# Patient Record
Sex: Female | Born: 1985 | Race: White | Hispanic: No | Marital: Married | Smoking: Never smoker
Health system: Southern US, Community
[De-identification: ages and names within clinical notes are randomized; demographics above are authoritative.]

## PROBLEM LIST (undated history)

## (undated) DIAGNOSIS — T7840XA Allergy, unspecified, initial encounter: Secondary | ICD-10-CM

## (undated) HISTORY — PX: NO PAST SURGERIES: SHX2092

## (undated) HISTORY — DX: Allergy, unspecified, initial encounter: T78.40XA

---

## 2011-06-18 ENCOUNTER — Telehealth: Payer: Self-pay

## 2011-06-18 NOTE — Telephone Encounter (Signed)
Pt completed [employer], all records were copied out of [facility] chart, Pt called for Pick-up LMO Cell Phone.  06/18/11/km

## 2015-06-21 ENCOUNTER — Ambulatory Visit (INDEPENDENT_AMBULATORY_CARE_PROVIDER_SITE_OTHER): Payer: 59 | Admitting: Physician Assistant

## 2015-06-21 VITALS — BP 120/80 | HR 109 | Temp 98.0°F | Resp 16 | Ht 66.0 in | Wt 136.0 lb

## 2015-06-21 DIAGNOSIS — L505 Cholinergic urticaria: Secondary | ICD-10-CM

## 2015-06-21 MED ORDER — RANITIDINE HCL 150 MG PO TABS: 150.0000 mg | ORAL_TABLET | Freq: Two times a day (BID) | ORAL | Status: DC

## 2015-06-21 NOTE — Progress Notes (Signed)
   Subjective:    Patient ID: Beverly Cabrera, female    DOB: 1986-04-18, 29 y.o.   MRN: [id number]  HPI Patient presents for reaction to cold. Two days ago when cleaning out freezer hands became red and itchy and that night while drinking cold water felt as if throat was closing and tongue was bigger than it should be. Then while under A/C skin felt itchy. Left ice on skin to see if would have reaction. After 5 minutes had hives. Only thing that was different 2 days ago is she stopped taking her progesterone BC pill. Had only taken it for 1 week, but noticed a decrease in libido so discontinued. Had baby 2 months ago and is now using condoms to prevent pregnancy. Denies wheezing, SOB, choking, or apnea. Able to drink room temp water without any trouble. Took cetirizine last night with relief. NKDA.   Review of Systems  Constitutional: Negative for fever, chills and fatigue.  Skin: Positive for rash (resolved). Negative for color change, pallor and wound.  Neurological: Negative for dizziness and headaches.       Objective:   Physical Exam  Constitutional: She is oriented to person, place, and time. She appears well-developed and well-nourished. No distress.  Blood pressure 120/80, pulse 109, temperature 98 F (36.7 C), temperature source Oral, resp. rate 16, height  (1.676 m), weight 136 lb (61.689 kg), SpO2 98 %.   HENT:  Head: Normocephalic and atraumatic.  Right Ear: External ear normal.  Left Ear: External ear normal.  Mouth/Throat: Oropharynx is clear and moist. No oropharyngeal exudate.  Eyes: Conjunctivae are normal. Right eye exhibits no discharge. Left eye exhibits no discharge. No scleral icterus.  Neck: Normal range of motion. Neck supple.  Cardiovascular: Normal rate, regular rhythm and normal heart sounds.  Exam reveals no gallop and no friction rub.   No murmur heard. Pulmonary/Chest: Effort normal and breath sounds normal. No respiratory distress. She has no wheezes.  She has no rales.  Abdominal: Soft. Bowel sounds are normal.  Lymphadenopathy:    She has no cervical adenopathy.  Neurological: She is alert and oriented to person, place, and time.  Skin: Skin is warm and dry. No rash noted. She is not diaphoretic. No erythema. No pallor.  Hives do not develop following ice test  Psychiatric: She has a normal mood and affect. Her behavior is normal. Judgment and thought content normal.      Assessment & Plan:  1. Cholinergic urticaria Discussed risk of continuing cetirizine and if going to use should increase water intake to at least 64 oz daily. That said cetirizine is not recommended with breast feeding. Infant side effects discussed. - ranitidine (ZANTAC) 150 MG tablet; Take 1 tablet (150 mg total) by mouth 2 (two) times daily.  Dispense: 60 tablet; Refill: 0 - Ambulatory referral to Allergy   Janan Ridge PA-C  [facility] 06/21/2015 2:03 PM

## 2016-11-22 ENCOUNTER — Encounter (HOSPITAL_COMMUNITY): Payer: Self-pay

## 2016-12-09 ENCOUNTER — Other Ambulatory Visit (HOSPITAL_COMMUNITY): Payer: Self-pay | Admitting: Certified Nurse Midwife

## 2016-12-09 DIAGNOSIS — O358XX0 Maternal care for other (suspected) fetal abnormality and damage, not applicable or unspecified: Secondary | ICD-10-CM

## 2016-12-09 DIAGNOSIS — O43199 Other malformation of placenta, unspecified trimester: Secondary | ICD-10-CM

## 2016-12-09 DIAGNOSIS — IMO0002 Reserved for concepts with insufficient information to code with codable children: Secondary | ICD-10-CM

## 2016-12-09 DIAGNOSIS — O283 Abnormal ultrasonic finding on antenatal screening of mother: Secondary | ICD-10-CM

## 2016-12-09 DIAGNOSIS — Z3689 Encounter for other specified antenatal screening: Secondary | ICD-10-CM

## 2016-12-16 ENCOUNTER — Encounter (HOSPITAL_COMMUNITY): Payer: Self-pay | Admitting: *Deleted

## 2016-12-17 ENCOUNTER — Other Ambulatory Visit (HOSPITAL_COMMUNITY): Payer: Self-pay | Admitting: Certified Nurse Midwife

## 2016-12-17 ENCOUNTER — Encounter (HOSPITAL_COMMUNITY): Payer: Self-pay

## 2016-12-17 ENCOUNTER — Ambulatory Visit (HOSPITAL_COMMUNITY)
Admission: RE | Admit: 2016-12-17 | Discharge: 2016-12-17 | Disposition: A | Discharge status: Home / Self Care | Payer: Self-pay | Source: Ambulatory Visit | Attending: Certified Nurse Midwife | Admitting: Certified Nurse Midwife

## 2016-12-17 DIAGNOSIS — O283 Abnormal ultrasonic finding on antenatal screening of mother: Secondary | ICD-10-CM

## 2016-12-17 DIAGNOSIS — IMO0002 Reserved for concepts with insufficient information to code with codable children: Secondary | ICD-10-CM

## 2016-12-17 DIAGNOSIS — Z3689 Encounter for other specified antenatal screening: Secondary | ICD-10-CM

## 2016-12-17 DIAGNOSIS — O358XX0 Maternal care for other (suspected) fetal abnormality and damage, not applicable or unspecified: Secondary | ICD-10-CM

## 2016-12-17 DIAGNOSIS — Z3A22 22 weeks gestation of pregnancy: Secondary | ICD-10-CM

## 2016-12-17 DIAGNOSIS — O43199 Other malformation of placenta, unspecified trimester: Secondary | ICD-10-CM

## 2016-12-17 DIAGNOSIS — Z363 Encounter for antenatal screening for malformations: Secondary | ICD-10-CM | POA: Insufficient documentation

## 2017-04-09 IMAGING — US US MFM OB DETAIL+14 WK
1 series · 14 of 28 positions shown · non-contrast
Comparison: none

[Series 1: us mfm ob detail+14 wk · 88 acquisitions, 14 frames shown]
[im 4/88]
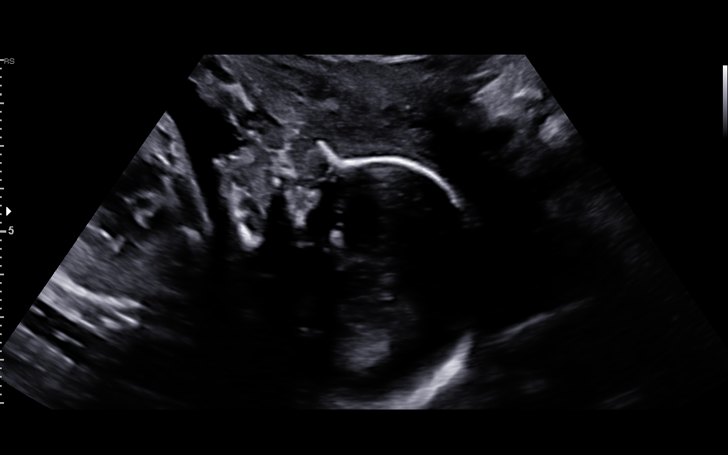
[im 10/88]
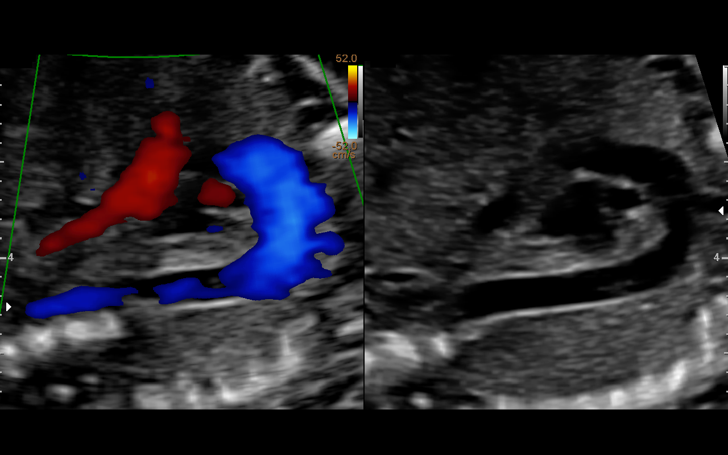
[im 17/88]
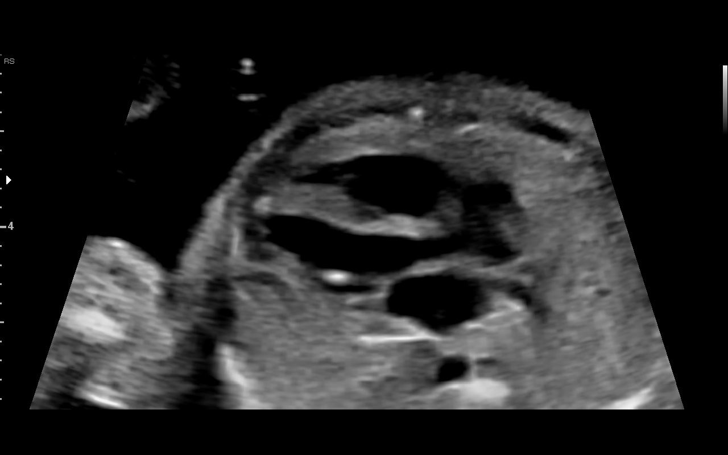
[im 23/88]
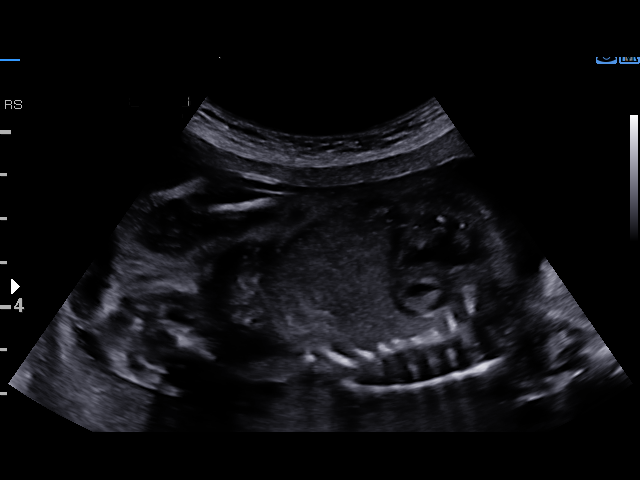
[im 30/88]
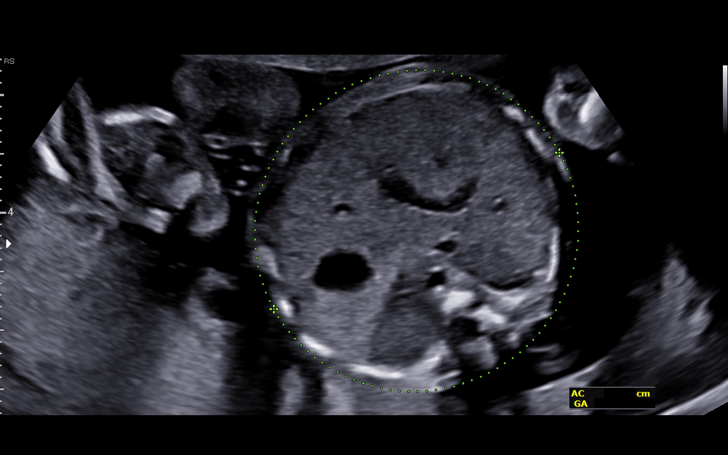
[im 36/88]
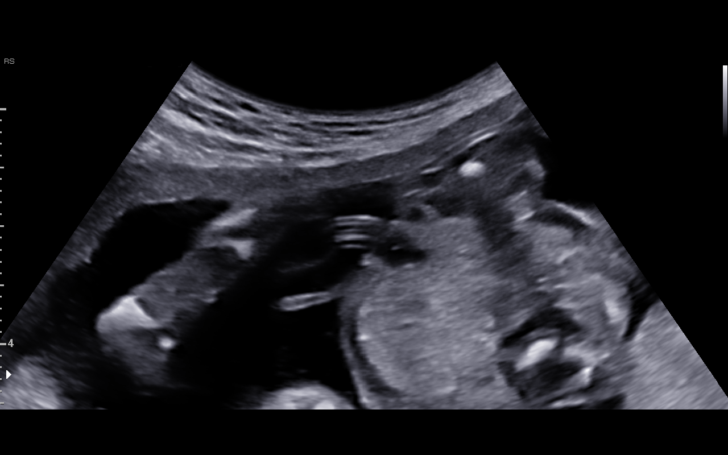
[im 42/88]
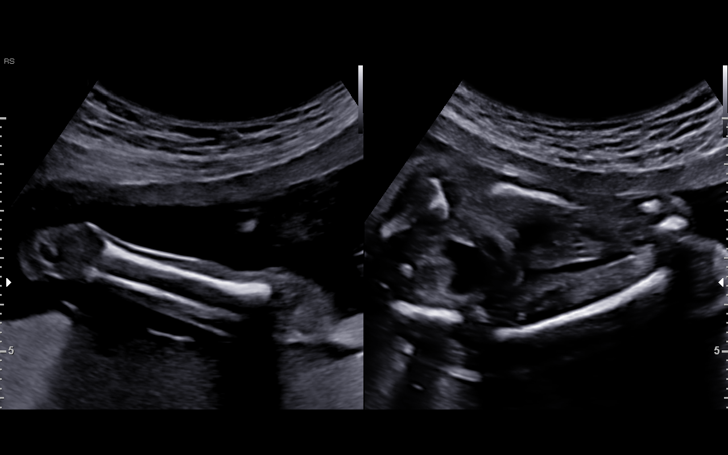
[im 49/88]
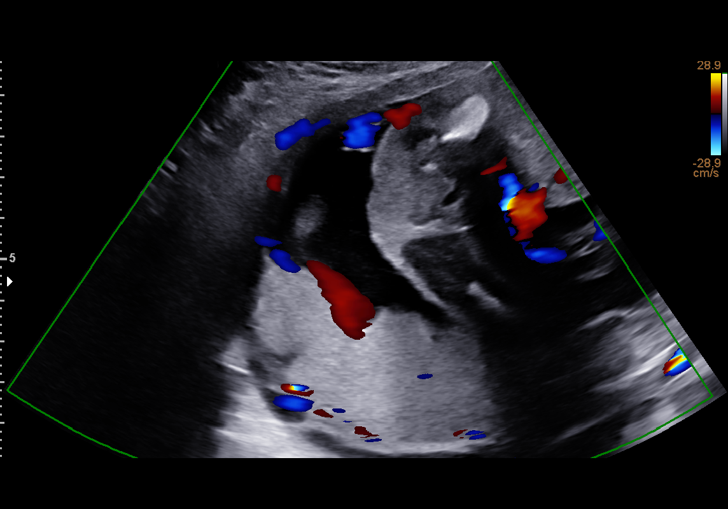
[im 55/88]
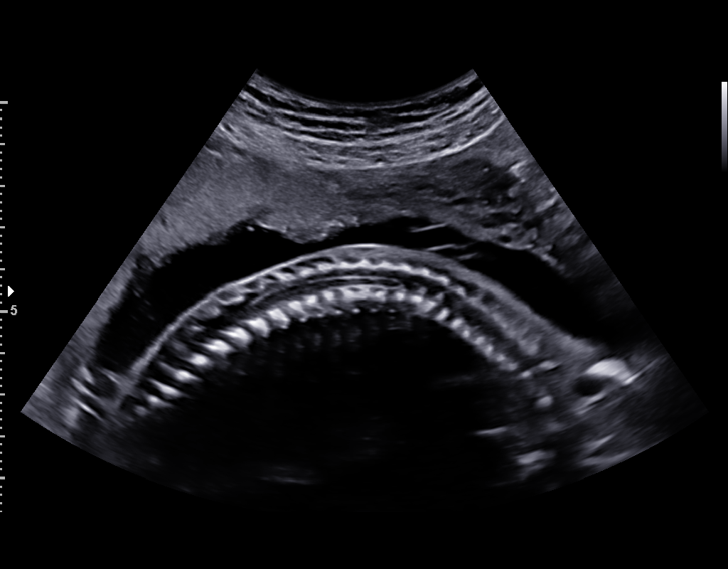
[im 62/88]
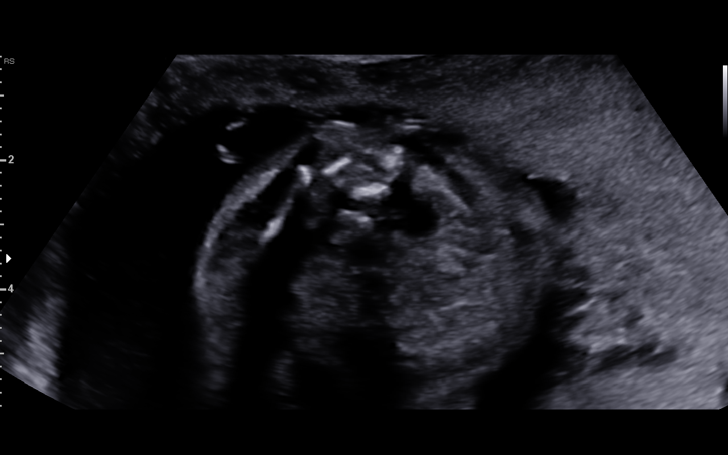
[im 68/88]
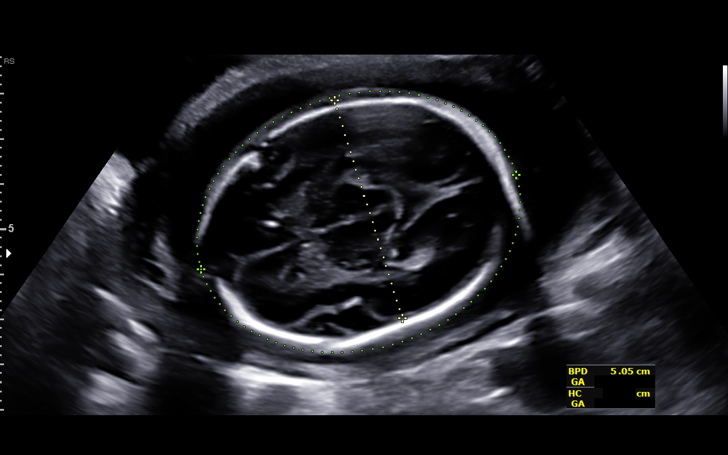
[im 75/88]
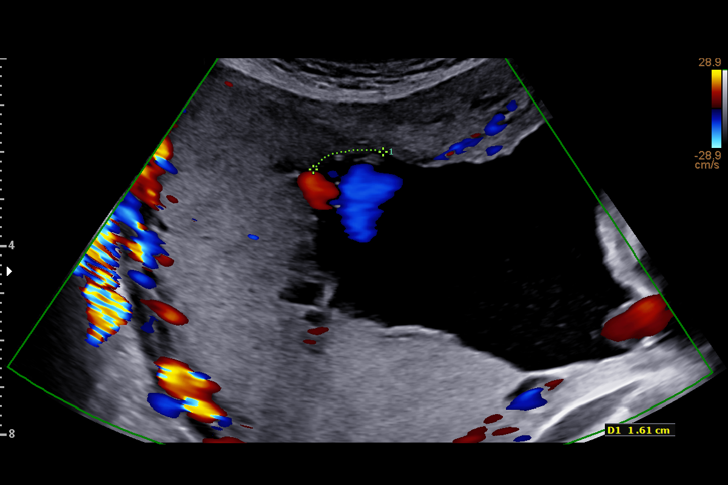
[im 81/88]
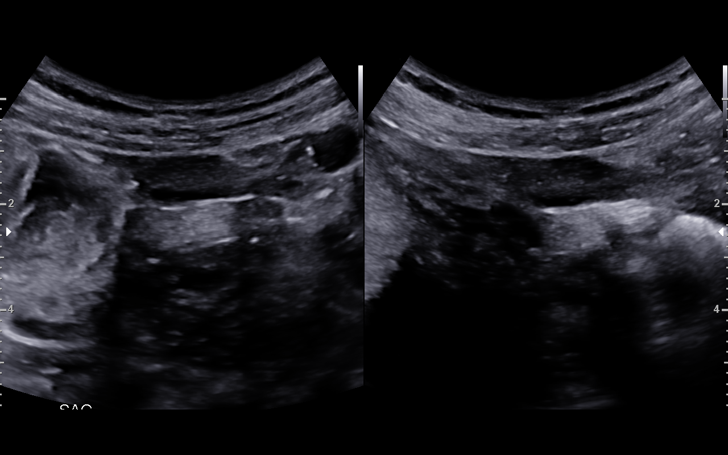
[im 88/88]
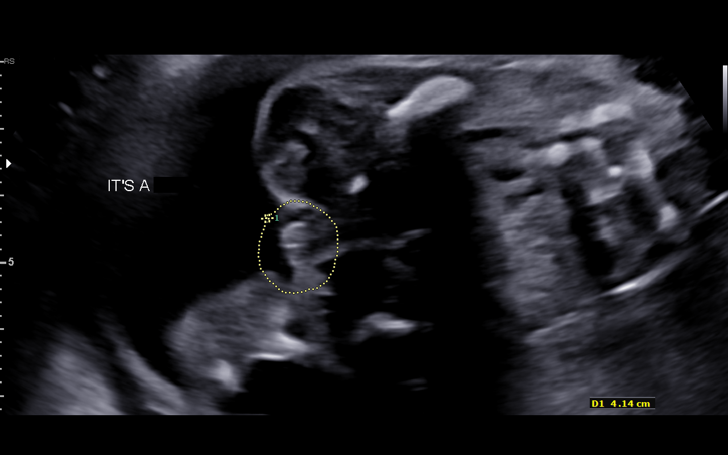

[14 of 28 positions shown; findings below may reference images not displayed]

1  DON LOLITO LACEN             [id number]      [id number]     [id number]
Indications

22 weeks gestation of pregnancy
Encounter for antenatal screening for
malformations
Other umbilical cord
complications,antepartum condition or
complication (marginal insert)
Echogenic bowel
Echogenic intracardiac focus of the heart
(EIF)
OB History

Gravidity:    3         Term:   2
Living:       2
Fetal Evaluation

Num Of Fetuses:     1
Fetal Heart         151
Rate(bpm):
Cardiac Activity:   Observed
Presentation:       Cephalic
Placenta:           Posterior Fundal, above cervical os
P. Cord Insertion:  Marginal insertion

Amniotic Fluid
AFI FV:      Subjectively within normal limits

Largest Pocket(cm)
4.5

Comment:    The cord insertion is visualized 1.6cm from the placental edge.
Biometry

BPD:      51.7  mm     G. Age:  21w 5d         34  %    CI:         67.4   %    70 - 86
FL/HC:      19.9   %    18.4 -
HC:      201.6  mm     G. Age:  22w 2d         51  %    HC/AC:      1.17        1.06 -
AC:      172.1  mm     G. Age:  22w 1d         48  %    FL/BPD:     77.8   %    71 - 87
FL:       40.2  mm     G. Age:  23w 0d         73  %    FL/AC:      23.4   %    20 - 24
HUM:      37.8  mm     G. Age:  23w 2d         82  %

Est. FW:     507  gm      1 lb 2 oz     55  %
Gestational Age

LMP:           22w 0d        Date:  07/16/16                 EDD:   04/22/17
U/S Today:     22w 2d                                        EDD:   04/20/17
Best:          22w 0d     Det. By:  LMP  (07/16/16)          EDD:   04/22/17
Anatomy

Cranium:               Appears normal         Aortic Arch:            Appears normal
Cavum:                 Appears normal         Ductal Arch:            Appears normal
Ventricles:            Appears normal         Diaphragm:              Appears normal
Choroid Plexus:        Appears normal         Stomach:                Appears normal, left
sided
Cerebellum:            Not well visualized    Abdomen:                Appears normal
Posterior Fossa:       Not well visualized    Abdominal Wall:         Appears nml (cord
insert, abd wall)
Nuchal Fold:           Not applicable (>20    Cord Vessels:           Appears normal (3
wks GA)                                        vessel cord)
Face:                  Orbits appear          Kidneys:                Appear normal
normal
Lips:                  Not well visualized    Bladder:                Appears normal
Thoracic:              Appears normal         Spine:                  Appears normal
Heart:                 Echogenic focus        Upper Extremities:      Appears normal
in LV
RVOT:                  Appears normal         Lower Extremities:      Appears normal
LVOT:                  Appears normal

Other:  Fetus appears to be a female. Heels and Lt 5th digit visualized.
Technically difficult due to fetal position.
Cervix Uterus Adnexa

Cervix
Length:            3.7  cm.
Normal appearance by transabdominal scan.

Uterus
No abnormality visualized.

Left Ovary
Within normal limits.

Right Ovary
Within normal limits.

Adnexa:       No abnormality visualized. No adnexal mass
visualized.
Impression

Single living intrauterine pregnancy at 99w3d.
Appropriate fetal growth (55%).
Normal amniotic fluid volume.
Posterior, fundal placenta, above cervical os. Marginal cord
insertion.
The fetal anatomic survey is not complete, lips, profile and
posterior fossa not well visualized.
An echogenic focus is visualized in the left ventricle.
No echogenic bowel visualized today; the bowel appears
prominent but is not as bright as bone.
The remainder of the anatomic survey is complete.
No gross fetal anomalies identified.
Recommendations

The patient left prior to being seen for discussion of today's
ultrasound.
In a low-risk patient with an otherwise normal fetus, an
echogenic intracardiac focus may represent a normal variant.
The patient is outside the gestational age range at which
serum screening is available, but cell free DNA screening
may be offered.
Recommend follow-up ultrasound in 6 weeks to reevaluate
fetal anatomy and growth. Genetic consultation may be
scheduled at that time if desired.

## 2017-09-22 ENCOUNTER — Encounter (HOSPITAL_COMMUNITY): Payer: Self-pay

## 2019-12-17 ENCOUNTER — Ambulatory Visit: Payer: Self-pay | Admitting: Family Medicine

## 2021-06-30 ENCOUNTER — Emergency Department (HOSPITAL_BASED_OUTPATIENT_CLINIC_OR_DEPARTMENT_OTHER): Payer: Self-pay | Admitting: Radiology

## 2021-06-30 ENCOUNTER — Other Ambulatory Visit: Payer: Self-pay

## 2021-06-30 ENCOUNTER — Emergency Department (HOSPITAL_BASED_OUTPATIENT_CLINIC_OR_DEPARTMENT_OTHER)
Admission: EM | Admit: 2021-06-30 | Discharge: 2021-06-30 | Disposition: A | Discharge status: Home / Self Care | Payer: Self-pay | Attending: Emergency Medicine | Admitting: Emergency Medicine

## 2021-06-30 ENCOUNTER — Encounter (HOSPITAL_BASED_OUTPATIENT_CLINIC_OR_DEPARTMENT_OTHER): Payer: Self-pay | Admitting: *Deleted

## 2021-06-30 DIAGNOSIS — R091 Pleurisy: Secondary | ICD-10-CM | POA: Insufficient documentation

## 2021-06-30 DIAGNOSIS — R0789 Other chest pain: Secondary | ICD-10-CM

## 2021-06-30 DIAGNOSIS — R0602 Shortness of breath: Secondary | ICD-10-CM | POA: Insufficient documentation

## 2021-06-30 LAB — CBC WITH DIFFERENTIAL/PLATELET
Abs Immature Granulocytes: 0.01 10*3/uL (ref 0.00–0.07)
Basophils Absolute: 0 10*3/uL (ref 0.0–0.1)
Basophils Relative: 1 %
Eosinophils Absolute: 0 10*3/uL (ref 0.0–0.5)
Eosinophils Relative: 1 %
HCT: 39.9 % (ref 36.0–46.0)
Hemoglobin: 13.4 g/dL (ref 12.0–15.0)
Immature Granulocytes: 0 %
Lymphocytes Relative: 24 %
Lymphs Abs: 1.6 10*3/uL (ref 0.7–4.0)
MCH: 31.4 pg (ref 26.0–34.0)
MCHC: 33.6 g/dL (ref 30.0–36.0)
MCV: 93.4 fL (ref 80.0–100.0)
Monocytes Absolute: 0.7 10*3/uL (ref 0.1–1.0)
Monocytes Relative: 11 %
Neutro Abs: 4.1 10*3/uL (ref 1.7–7.7)
Neutrophils Relative %: 63 %
Platelets: 179 10*3/uL (ref 150–400)
RBC: 4.27 MIL/uL (ref 3.87–5.11)
RDW: 12.3 % (ref 11.5–15.5)
WBC: 6.5 10*3/uL (ref 4.0–10.5)
nRBC: 0 % (ref 0.0–0.2)

## 2021-06-30 LAB — BASIC METABOLIC PANEL
Anion gap: 7 (ref 5–15)
BUN: 10 mg/dL (ref 6–20)
CO2: 27 mmol/L (ref 22–32)
Calcium: 9.8 mg/dL (ref 8.9–10.3)
Chloride: 105 mmol/L (ref 98–111)
Creatinine, Ser: 0.68 mg/dL (ref 0.44–1.00)
GFR, Estimated: >60 mL/min (ref >60)
Glucose, Bld: 95 mg/dL (ref 70–99)
Potassium: 4.3 mmol/L (ref 3.5–5.1)
Sodium: 139 mmol/L (ref 135–145)

## 2021-06-30 LAB — D-DIMER, QUANTITATIVE: D-Dimer, Quant: 0.44 ug/mL-FEU (ref 0.00–0.50)

## 2021-06-30 LAB — BRAIN NATRIURETIC PEPTIDE: B Natriuretic Peptide: 13.4 pg/mL (ref 0.0–100.0)

## 2021-06-30 NOTE — ED Triage Notes (Signed)
Patient reports chest pain and shortness of breath that started last night around 730p, constant in nature, was able to go to sleep but needed pillows to prop up, reports pain when she was moving last night in bed.   Patient points to the mid/right upper abdomen under right breast.   Denies dizziness or diaphoresis. No recent travel or flights.

## 2021-06-30 NOTE — ED Provider Notes (Signed)
[facility] EMERGENCY DEPT Provider Note   CSN: [id number] Arrival date & time: 06/30/21  3818     History Chief Complaint  Patient presents with   Chest Pain   Shortness of Breath    Beverly Cabrera is a 35 y.o. female.  HPI    35 year old female comes in with chief complaint of chest pain and shortness of breath.  She has no significant medical history.  She reports that yesterday she started noticing some right lower anterior thoracic chest pain.  Pain is reproduced with her massaging that area and also with deep inspiration.  Pain is also worse with certain movement.  She denies any strenuous activity that triggered the pain.  No history of similar pain in the past. Pt has no hx of PE, DVT and denies any exogenous hormone (testosterone / estrogen) use, long distance travels or surgery in the past 6 weeks, active cancer, recent immobilization.  She denies any cough, fevers, chills , body aches.   Past Medical History:  Diagnosis Date   Allergy     There are no problems to display for this patient.   Past Surgical History:  Procedure Laterality Date   NO PAST SURGERIES       OB History     Gravida  3   Para  2   Term  2   Preterm      AB      Living  2      SAB      IAB      Ectopic      Multiple      Live Births              Family History  Problem Relation Age of Onset   Cancer Maternal Grandmother    Heart disease Maternal Grandmother     Social History   Tobacco Use   Smoking status: Never   Smokeless tobacco: Never  Substance Use Topics   Alcohol use: No    Alcohol/week: 0.0 standard drinks   Drug use: No    Home Medications Prior to Admission medications   Medication Sig Start Date End Date Taking? Authorizing Provider  magnesium 30 MG tablet Take 200 mg by mouth 2 (two) times daily.   Yes [provider]  Multiple Vitamin (MULTIVITAMIN) tablet Take 1 tablet by mouth daily.   Yes [provider]  Omega-3 Fatty Acids (FISH OIL) 1000 MG CAPS Take by mouth.   Yes [provider]  cetirizine (ZYRTEC) 10 MG tablet Take 10 mg by mouth daily.    [provider]  norethindrone (MICRONOR,CAMILA,ERRIN) 0.35 MG tablet Take 1 tablet by mouth daily.    [provider]  Prenatal Multivit-Min-Fe-FA (PRENATAL VITAMINS PO) Take by mouth.    [provider]  ranitidine (ZANTAC) 150 MG tablet Take 1 tablet (150 mg total) by mouth 2 (two) times daily. Patient not taking: Reported on 12/17/2016 06/21/15   [place], Mal Misty R, PA-C    Allergies    Patient has no known allergies.  Review of Systems   Review of Systems  Constitutional:  Positive for activity change.  HENT:  Negative for congestion.   Respiratory:  Positive for shortness of breath.   Cardiovascular:  Positive for chest pain.   Physical Exam Updated Vital Signs BP 114/77   Pulse 83   Temp 98.1 F (36.7 C)   Resp (!) 21   Ht 5\' 6"  (1.676 m)   Wt 51.3  kg   LMP 06/10/2021 (Exact Date)   SpO2 100%   BMI 18.24 kg/m   Physical Exam Vitals and nursing note reviewed.  Constitutional:      Appearance: She is well-developed.  HENT:     Head: Atraumatic.  Cardiovascular:     Rate and Rhythm: Normal rate.     Heart sounds: Normal heart sounds.  Pulmonary:     Effort: Pulmonary effort is normal.     Breath sounds: No decreased breath sounds, wheezing, rhonchi or rales.  Abdominal:     General: There is no abdominal bruit.  Musculoskeletal:     Cervical back: Normal range of motion and neck supple.  Skin:    General: Skin is warm and dry.  Neurological:     Mental Status: She is alert and oriented to person, place, and time.    ED Results / Procedures / Treatments   Labs (all labs ordered are listed, but only abnormal results are displayed) Labs Reviewed  BASIC METABOLIC PANEL  BRAIN NATRIURETIC PEPTIDE  CBC WITH DIFFERENTIAL/PLATELET  D-DIMER, QUANTITATIVE     EKG EKG Interpretation  Date/Time:  Saturday June 30 2021 09:24:52 EDT Ventricular Rate:  99 PR Interval:  167 QRS Duration: 92 QT Interval:  375 QTC Calculation: 482 R Axis:   83 Text Interpretation: Sinus rhythm Biatrial enlargement RSR' in V1 or V2, probably normal variant Borderline prolonged QT interval No old tracing to compare Confirmed by Derwood Kaplan [phone]) on 06/30/2021 10:01:02 AM  Radiology DG Chest 2 View  Result Date: 06/30/2021 CLINICAL DATA:  Pleuritic chest pain.  Short of breath EXAM: CHEST - 2 VIEW COMPARISON:  None. FINDINGS: Normal mediastinum and cardiac silhouette. Normal pulmonary vasculature. No evidence of effusion, infiltrate, or pneumothorax. Bilateral nipple shadows noted. No acute bony abnormality. IMPRESSION: No active cardiopulmonary disease. Electronically Signed   By: Genevive Bi M.D.   On: 06/30/2021 11:10    Procedures Procedures   X-rays independently reviewed by me.  No evidence of pneumothorax.  Medications Ordered in ED Medications - No data to display  ED Course  I have reviewed the triage vital signs and the nursing notes.  Pertinent labs & imaging results that were available during my care of the patient were reviewed by me and considered in my medical decision making (see chart for details).    MDM Rules/Calculators/A&P                           35 year old comes in with chief complaint of chest pain. She has right lower anterior thoracic chest pain that started yesterday.  Pain is sharp, mild to moderate in severity.  Pain worse with deep inspiration and when she leans forward.  I considered pleurisy, chest wall pain, pericarditis, PE in the differential diagnosis. Pain is not present over RUQ or flank region.  X-ray ordered.  No evidence of pneumothorax.  No consolidation over the right lower lung field.  We did order D-dimer, which is below the cutoff.  Patient is Wells score for PE is low risk.  Clinically, it  does not appear that she has pericarditis, EKG is not showing any evidence of diffuse ST elevation.  Results of the ER work-up discussed with the patient. She has called and set up an appoint with a new PCP in September.  For now she is comfortable with conservative measures.  Strict ER return precautions discussed with her.  Final Clinical Impression(s) / ED Diagnoses Final  diagnoses:  Pleurisy  Chest wall pain    Rx / DC Orders ED Discharge Orders     None        Derwood Kaplan, MD 06/30/21 1144

## 2021-06-30 NOTE — Discharge Instructions (Addendum)
Based on our assessment, it appears that you most likely have either chest wall pain or pleurisy.  Treatment of both is going to be supportive with anti-inflammatory medication like ibuprofen every 6-8 hours for the next 3 to 5 days.  As discussed, the screening test for blood clot is normal.  However, if you start having worsening shortness of breath, worsening chest pain, dizziness or near fainting -please return to the ER immediately.  We do recommend that he see a primary care doctor soon as possible for further evaluation if the pain continues.

## 2021-08-01 ENCOUNTER — Other Ambulatory Visit (HOSPITAL_COMMUNITY)
Admission: RE | Admit: 2021-08-01 | Discharge: 2021-08-01 | Disposition: A | Discharge status: Home / Self Care | Payer: No Typology Code available for payment source | Source: Ambulatory Visit | Attending: Obstetrics & Gynecology | Admitting: Obstetrics & Gynecology

## 2021-08-01 ENCOUNTER — Other Ambulatory Visit: Payer: Self-pay

## 2021-08-01 ENCOUNTER — Ambulatory Visit (INDEPENDENT_AMBULATORY_CARE_PROVIDER_SITE_OTHER): Payer: No Typology Code available for payment source | Admitting: Obstetrics & Gynecology

## 2021-08-01 ENCOUNTER — Encounter: Payer: Self-pay | Admitting: Obstetrics & Gynecology

## 2021-08-01 VITALS — BP 92/63 | HR 89 | Ht 66.0 in | Wt 115.2 lb

## 2021-08-01 DIAGNOSIS — Z01419 Encounter for gynecological examination (general) (routine) without abnormal findings: Secondary | ICD-10-CM | POA: Diagnosis present

## 2021-08-01 DIAGNOSIS — Z113 Encounter for screening for infections with a predominantly sexual mode of transmission: Secondary | ICD-10-CM

## 2021-08-01 NOTE — Progress Notes (Signed)
   WELL-WOMAN EXAMINATION Patient name: Beverly Cabrera MRN [id number]  Date of birth: [birth year] Chief Complaint:   Gynecologic Exam  History of Present Illness:   Beverly Cabrera is a 35 y.o. G68P3003  female being seen today for a routine well-woman exam.  Today she notes: no acute complaints  In the process of completing adoption- paperwork to be completed along with blood work   No LMP recorded (lmp unknown). Denies issues with her menses- regular each month The current method of family planning is vasectomy.    Last pap several years ago.  Last mammogram: n/a. Last colonoscopy: n/a  Depression screen [facility] 2/9 08/01/2021 06/21/2015  Decreased Interest 0 0  Down, Depressed, Hopeless 0 0  PHQ - 2 Score 0 0      Review of Systems:   Pertinent items are noted in HPI Denies any headaches, blurred vision, fatigue, shortness of breath, chest pain, abdominal pain, bowel movements, urination, or intercourse unless otherwise stated above.  Pertinent History Reviewed:  Reviewed past medical,surgical, social and family history.  Reviewed problem list, medications and allergies. Physical Assessment:   Vitals:   08/01/21 1530  BP: 92/63  Pulse: 89  Weight: 115 lb 3.2 oz (52.3 kg)  Height: _0  (1.676 m)  Body mass index is 18.59 kg/m.        Physical Examination:   General appearance - well appearing, and in no distress  Mental status - alert, oriented to person, place, and time  Psych:  She has a normal mood and affect  Skin - warm and dry, normal color, no suspicious lesions noted  Chest - effort normal, all lung fields clear to auscultation bilaterally  Heart - normal rate and regular rhythm  Neck:  midline trachea, no thyromegaly or nodules  Breasts - breasts appear normal, no suspicious masses, no skin or nipple changes or  axillary nodes  Abdomen - soft, nontender, nondistended, no masses or organomegaly  Pelvic - VULVA: normal appearing vulva with no masses,  tenderness or lesions  VAGINA: normal appearing vagina with normal color and discharge, no lesions  CERVIX: normal appearing cervix without discharge or lesions, no CMT  Thin prep pap is done with HR HPV cotesting  UTERUS: uterus is felt to be normal size, shape, consistency and nontender   ADNEXA: No adnexal masses or tenderness noted.  Extremities:  No swelling or varicosities noted  Chaperone: Celene Squibb     Assessment & Plan:  1) Well-Woman Exam -pap collected reviewed guidelines -screening blood work to be completed  Orders Placed This Encounter  Procedures   HIV Antibody (routine testing w rflx)   Hepatitis B surface antigen   Urinalysis, Routine w reflex microscopic   Comp Met (CMET)     Meds: No orders of the defined types were placed in this encounter.   Follow-up: Return in about 1 year (around 08/01/2022) for Annual.   Janyth Pupa, DO Attending [place], [place] for [facility], [place]

## 2021-08-02 LAB — COMPREHENSIVE METABOLIC PANEL
ALT: 9 IU/L (ref 0–32)
AST: 16 IU/L (ref 0–40)
Albumin/Globulin Ratio: 2.6 — ABNORMAL HIGH (ref 1.2–2.2)
Albumin: 5.4 g/dL — ABNORMAL HIGH (ref 3.8–4.8)
Alkaline Phosphatase: 42 IU/L — ABNORMAL LOW (ref 44–121)
BUN/Creatinine Ratio: 23 (ref 9–23)
BUN: 19 mg/dL (ref 6–20)
Bilirubin Total: 2 mg/dL — ABNORMAL HIGH (ref 0.0–1.2)
CO2: 23 mmol/L (ref 20–29)
Calcium: 9.5 mg/dL (ref 8.7–10.2)
Chloride: 102 mmol/L (ref 96–106)
Creatinine, Ser: 0.83 mg/dL (ref 0.57–1.00)
Globulin, Total: 2.1 g/dL (ref 1.5–4.5)
Glucose: 98 mg/dL (ref 65–99)
Potassium: 4 mmol/L (ref 3.5–5.2)
Sodium: 140 mmol/L (ref 134–144)
Total Protein: 7.5 g/dL (ref 6.0–8.5)
eGFR: 95 mL/min/{1.73_m2} (ref >59)

## 2021-08-02 LAB — URINALYSIS, ROUTINE W REFLEX MICROSCOPIC
Bilirubin, UA: NEGATIVE
Glucose, UA: NEGATIVE
Ketones, UA: NEGATIVE
Nitrite, UA: NEGATIVE
Protein,UA: NEGATIVE
RBC, UA: NEGATIVE
Specific Gravity, UA: 1.022 (ref 1.005–1.030)
Urobilinogen, Ur: 0.2 mg/dL (ref 0.2–1.0)
pH, UA: 8 — ABNORMAL HIGH (ref 5.0–7.5)

## 2021-08-02 LAB — MICROSCOPIC EXAMINATION
Bacteria, UA: NONE SEEN
Casts: NONE SEEN /lpf
WBC, UA: NONE SEEN /hpf (ref 0–5)

## 2021-08-02 LAB — HIV ANTIBODY (ROUTINE TESTING W REFLEX): HIV Screen 4th Generation wRfx: NONREACTIVE

## 2021-08-02 LAB — HEPATITIS B SURFACE ANTIGEN: Hepatitis B Surface Ag: NEGATIVE

## 2021-08-07 LAB — CYTOLOGY - PAP
Comment: NEGATIVE
Diagnosis: NEGATIVE
Diagnosis: REACTIVE
High risk HPV: NEGATIVE

## 2021-10-21 IMAGING — DX DG CHEST 2V
2 series · 2 of 2 positions shown · non-contrast
Comparison: None.

CLINICAL DATA: Pleuritic chest pain.  Short of breath

EXAM:
CHEST - 2 VIEW

[chest pa]
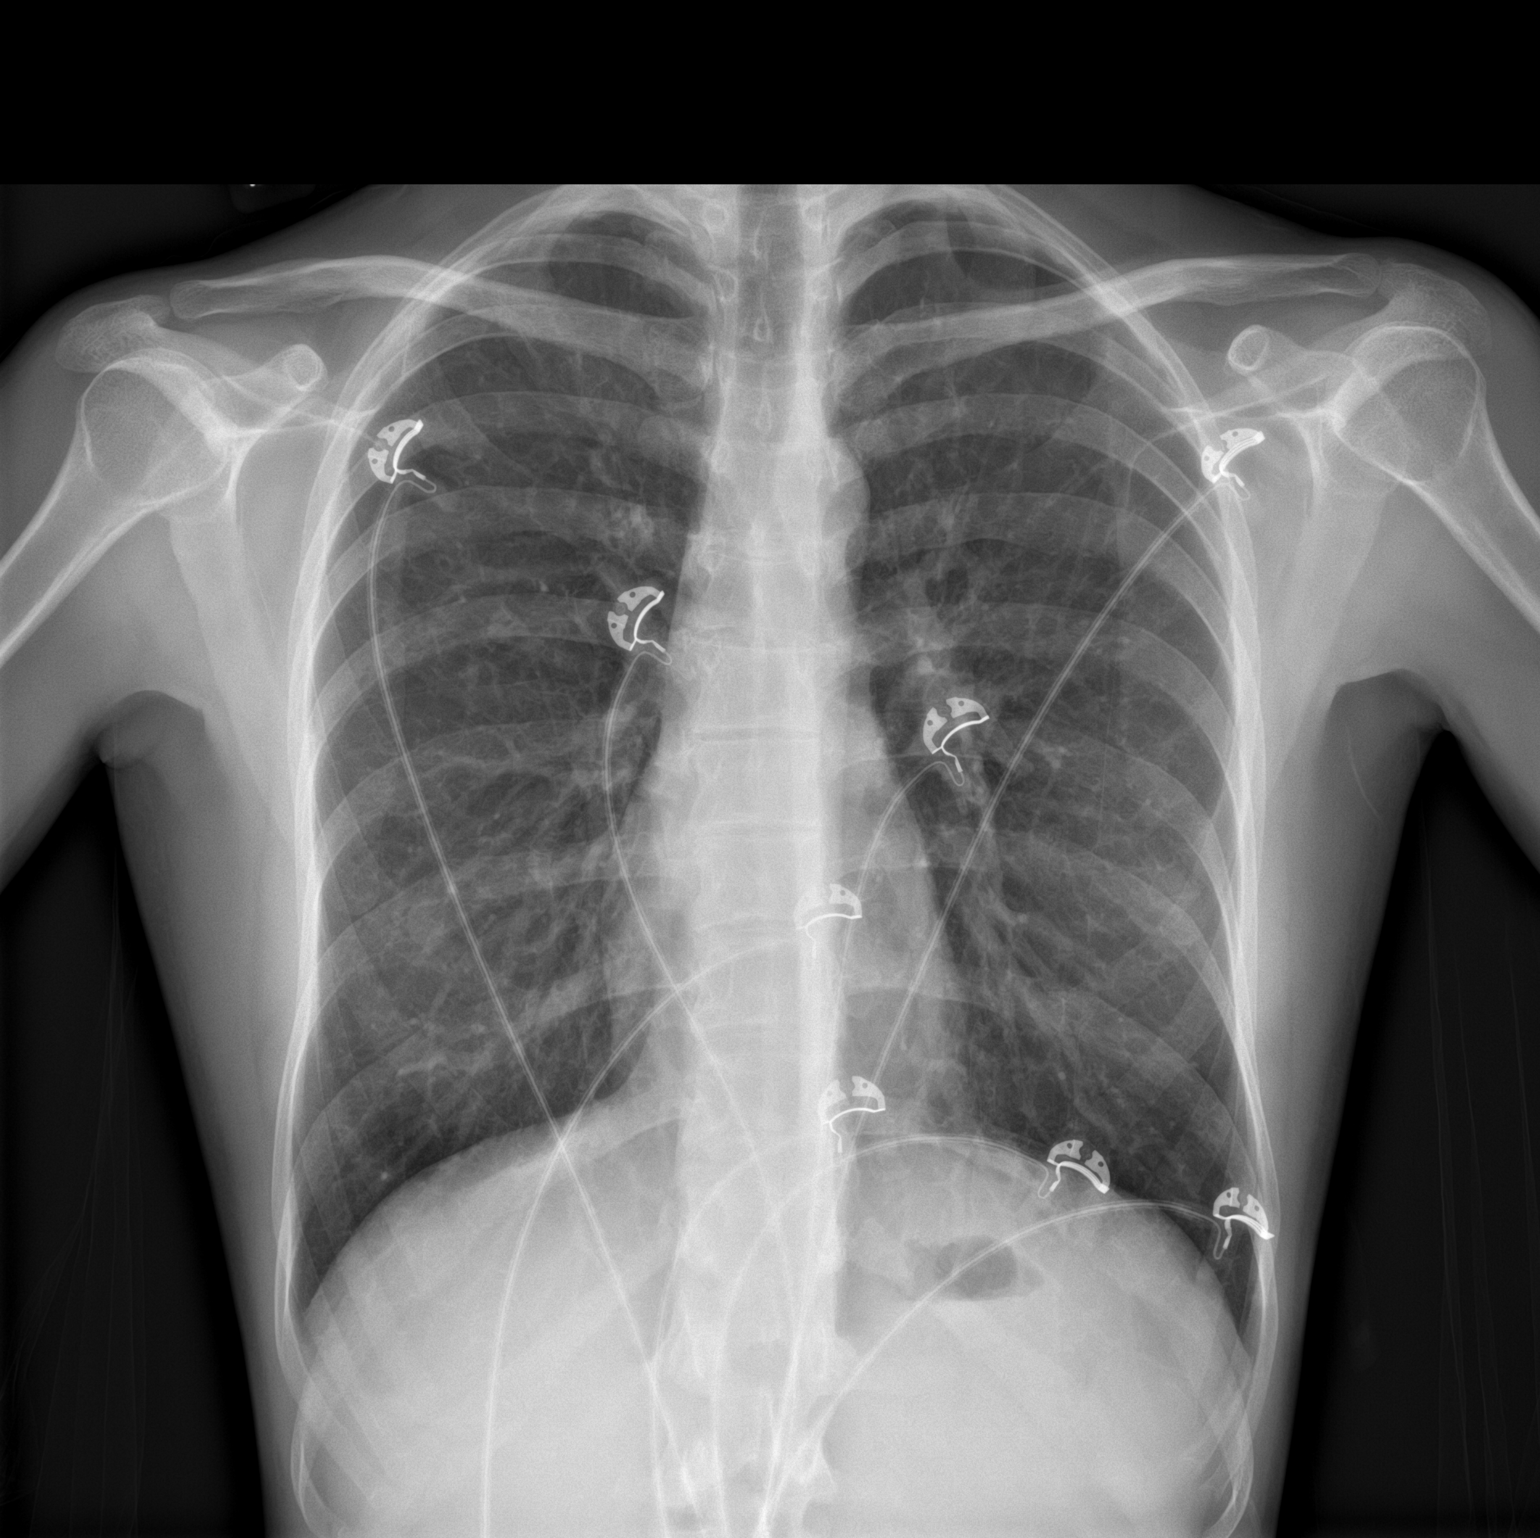

[chest lat]
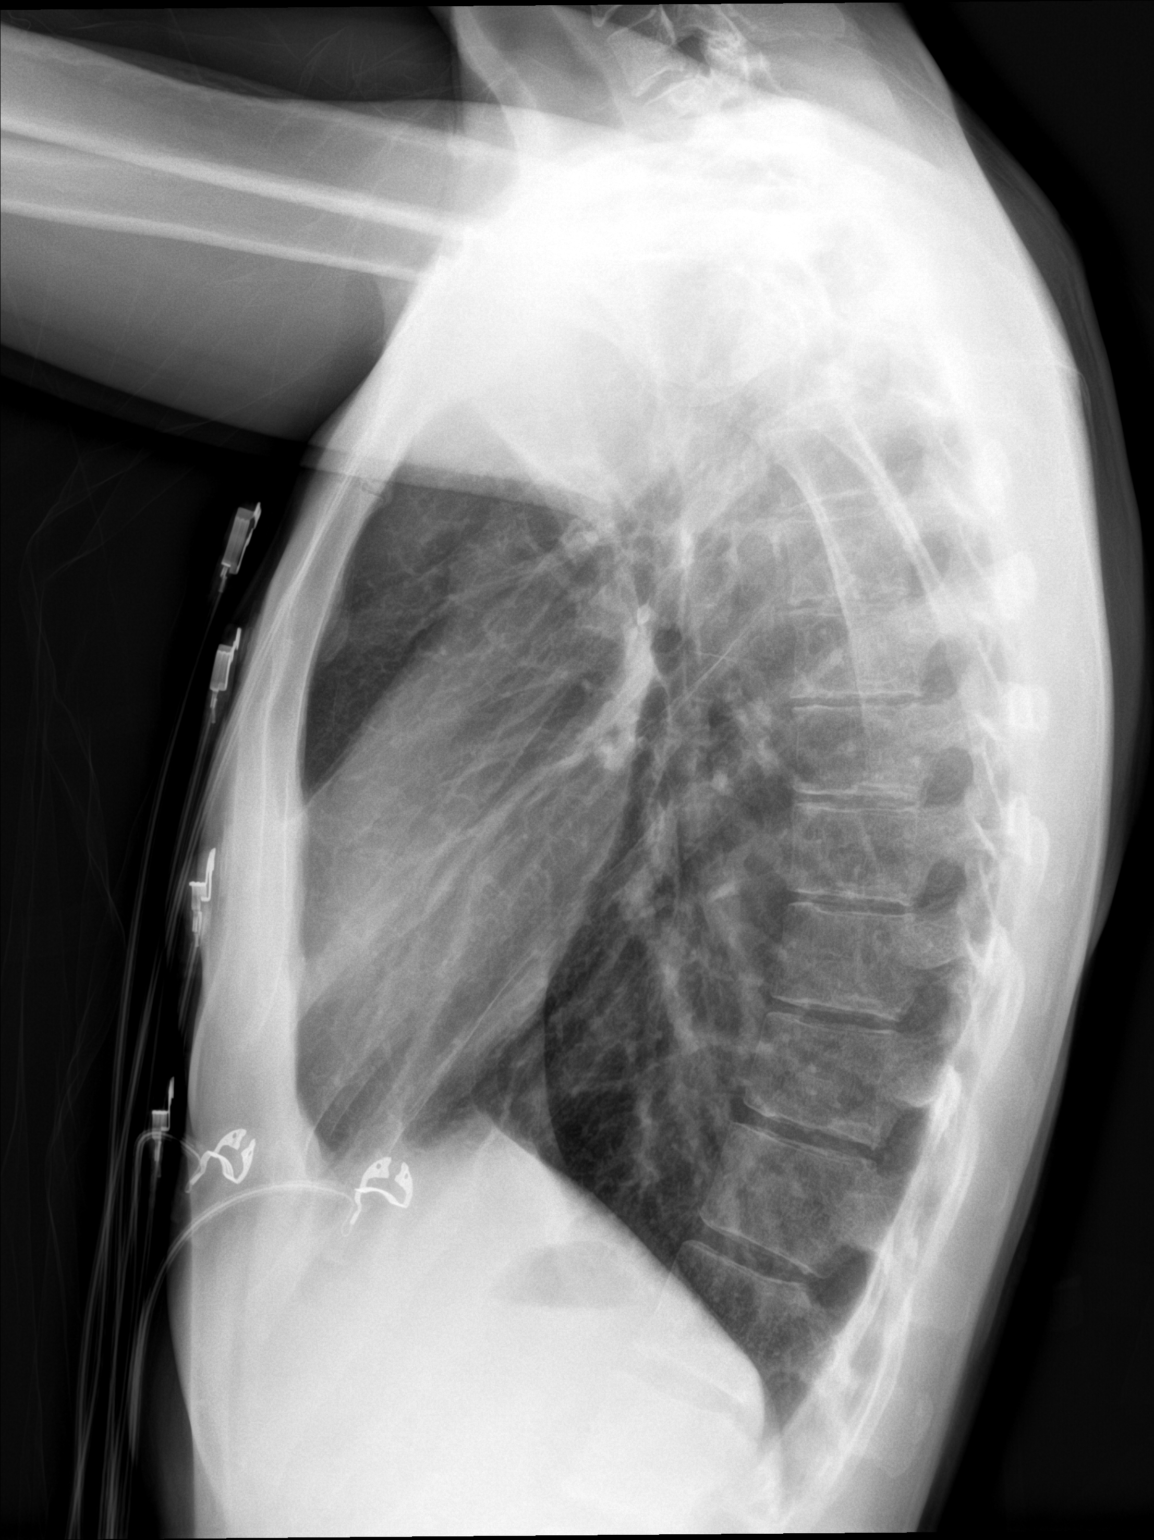

[2 of 2 positions shown; findings below may reference images not displayed]

FINDINGS: Normal mediastinum and cardiac silhouette. Normal pulmonary
vasculature. No evidence of effusion, infiltrate, or pneumothorax.
Bilateral nipple shadows noted. No acute bony abnormality.
IMPRESSION: No active cardiopulmonary disease.

## 2022-01-06 ENCOUNTER — Encounter: Payer: Self-pay | Admitting: Obstetrics & Gynecology

## 2022-01-08 ENCOUNTER — Encounter: Payer: Self-pay | Admitting: Obstetrics & Gynecology
